# Patient Record
Sex: Female | Born: 1965 | Race: Black or African American | Hispanic: No | Smoking: Never smoker
Health system: Southern US, Community
[De-identification: ages and names within clinical notes are randomized; demographics above are authoritative.]

---

## 2005-02-14 ENCOUNTER — Other Ambulatory Visit: Payer: Self-pay

## 2005-02-14 ENCOUNTER — Emergency Department: Payer: Self-pay | Admitting: Emergency Medicine

## 2005-04-11 ENCOUNTER — Emergency Department: Payer: Self-pay | Admitting: Emergency Medicine

## 2005-05-03 ENCOUNTER — Ambulatory Visit: Payer: Self-pay | Admitting: Gastroenterology

## 2006-07-18 ENCOUNTER — Emergency Department: Payer: Self-pay | Admitting: Emergency Medicine

## 2006-07-18 ENCOUNTER — Other Ambulatory Visit: Payer: Self-pay

## 2007-01-27 ENCOUNTER — Ambulatory Visit: Payer: Self-pay | Admitting: Unknown Physician Specialty

## 2007-01-29 ENCOUNTER — Ambulatory Visit: Payer: Self-pay | Admitting: Unknown Physician Specialty

## 2007-02-15 ENCOUNTER — Emergency Department: Payer: Self-pay | Admitting: Emergency Medicine

## 2007-02-16 ENCOUNTER — Emergency Department: Payer: Self-pay | Admitting: Emergency Medicine

## 2007-02-16 ENCOUNTER — Inpatient Hospital Stay: Payer: Self-pay | Admitting: Surgery

## 2007-06-01 ENCOUNTER — Ambulatory Visit: Payer: Self-pay | Admitting: Internal Medicine

## 2007-06-10 ENCOUNTER — Ambulatory Visit: Payer: Self-pay | Admitting: Internal Medicine

## 2014-03-07 ENCOUNTER — Ambulatory Visit (INDEPENDENT_AMBULATORY_CARE_PROVIDER_SITE_OTHER): Payer: BC Managed Care – PPO

## 2014-03-07 ENCOUNTER — Encounter: Payer: Self-pay | Admitting: Podiatry

## 2014-03-07 ENCOUNTER — Ambulatory Visit (INDEPENDENT_AMBULATORY_CARE_PROVIDER_SITE_OTHER): Payer: BC Managed Care – PPO | Admitting: Podiatry

## 2014-03-07 VITALS — BP 125/82 | HR 65 | Resp 16 | Ht 64.0 in | Wt 196.0 lb

## 2014-03-07 DIAGNOSIS — M779 Enthesopathy, unspecified: Secondary | ICD-10-CM

## 2014-03-07 DIAGNOSIS — M722 Plantar fascial fibromatosis: Secondary | ICD-10-CM

## 2014-03-07 DIAGNOSIS — Q6650 Congenital pes planus, unspecified foot: Secondary | ICD-10-CM

## 2014-03-07 MED ORDER — MELOXICAM 15 MG PO TABS: 15.0000 mg | ORAL_TABLET | Freq: Every day | ORAL | Status: DC

## 2014-03-07 MED ORDER — METHYLPREDNISOLONE (PAK) 4 MG PO TABS: ORAL_TABLET | ORAL | Status: DC

## 2014-03-07 NOTE — Progress Notes (Signed)
She presents today complaining of bilateral heel pain. She states this been going on for quite some time now is starting to affect her ability to perform her daily activities. She denies any trauma to the feet and currently is not taking the medication by mouth.  Objective: Vital signs are stable she is alert and oriented x3. Pulses are strongly palpable bilateral. Neurologic sensorium is intact per Semmes-Weinstein monofilament. Deep tendon reflexes are intact bilateral. Muscle strength is 5 over 5 dorsiflexors plantar flexors inverters everters all intrinsic musculature is intact. Orthopedic evaluation demonstrates all joints distal to the ankle a full range of motion without crepitation. She has pain on palpation medial continued tubercles bilateral. Radiographic evaluation does demonstrate a soft tissue increase in density at the plantar fascial calcaneal insertion sites bilateral plantar distally oriented calcaneal heel spur with mild to moderate pes planus bilateral. Mild hallux valgus deformity is also noted bilateral.  Assessment: Pain in limb secondary to pes planus associated with plantar fasciitis bilateral.  Plan: Discussed etiology pathology conservative versus surgical therapies. Discussed appropriate shoe gear stretching exercises ice therapy and shoe gear modifications. Injected the bilateral heels today with Kenalog and local anesthetic. Started her on Medrol Dosepak to be followed by meloxicam. Put her in a plantar fascial brace bilaterally. She will start utilizing a night splint she has at home. I will followup with her in one month.

## 2014-03-09 ENCOUNTER — Inpatient Hospital Stay: Payer: Self-pay | Admitting: Surgery

## 2014-03-09 LAB — COMPREHENSIVE METABOLIC PANEL
ALK PHOS: 62 U/L
AST: 22 U/L (ref 15–37)
Albumin: 4.4 g/dL (ref 3.4–5.0)
Anion Gap: 11 (ref 7–16)
BUN: 22 mg/dL — ABNORMAL HIGH (ref 7–18)
Bilirubin,Total: 0.6 mg/dL (ref 0.2–1.0)
CALCIUM: 9.6 mg/dL (ref 8.5–10.1)
CO2: 27 mmol/L (ref 21–32)
CREATININE: 0.77 mg/dL (ref 0.60–1.30)
Chloride: 101 mmol/L (ref 98–107)
EGFR (African American): >60
GLUCOSE: 129 mg/dL — AB (ref 65–99)
OSMOLALITY: 283 (ref 275–301)
POTASSIUM: 3.8 mmol/L (ref 3.5–5.1)
SGPT (ALT): 28 U/L
Sodium: 139 mmol/L (ref 136–145)
TOTAL PROTEIN: 8.7 g/dL — AB (ref 6.4–8.2)

## 2014-03-09 LAB — URINALYSIS, COMPLETE
BLOOD: NEGATIVE
Bilirubin,UR: NEGATIVE
GLUCOSE, UR: NEGATIVE mg/dL (ref 0–75)
LEUKOCYTE ESTERASE: NEGATIVE
NITRITE: NEGATIVE
PH: 6 (ref 4.5–8.0)
Protein: 100
SPECIFIC GRAVITY: 1.027 (ref 1.003–1.030)
Squamous Epithelial: 5
WBC UR: 4 /HPF (ref 0–5)

## 2014-03-09 LAB — CBC
HCT: 43.1 % (ref 35.0–47.0)
HGB: 14.5 g/dL (ref 12.0–16.0)
MCH: 31.7 pg (ref 26.0–34.0)
MCHC: 33.6 g/dL (ref 32.0–36.0)
MCV: 94 fL (ref 80–100)
Platelet: 271 10*3/uL (ref 150–440)
RBC: 4.57 10*6/uL (ref 3.80–5.20)
RDW: 13.4 % (ref 11.5–14.5)
WBC: 10.8 10*3/uL (ref 3.6–11.0)

## 2014-03-09 LAB — LIPASE, BLOOD: Lipase: 115 U/L (ref 73–393)

## 2014-03-09 LAB — TROPONIN I

## 2014-03-10 LAB — PROTIME-INR
INR: 1.1
PROTHROMBIN TIME: 13.7 s (ref 11.5–14.7)

## 2014-03-10 LAB — COMPREHENSIVE METABOLIC PANEL
ALBUMIN: 3.3 g/dL — AB (ref 3.4–5.0)
ALK PHOS: 48 U/L
ALT: 23 U/L
Anion Gap: 7 (ref 7–16)
BILIRUBIN TOTAL: 0.4 mg/dL (ref 0.2–1.0)
BUN: 14 mg/dL (ref 7–18)
CALCIUM: 8.3 mg/dL — AB (ref 8.5–10.1)
Chloride: 108 mmol/L — ABNORMAL HIGH (ref 98–107)
Co2: 29 mmol/L (ref 21–32)
Creatinine: 0.66 mg/dL (ref 0.60–1.30)
EGFR (Non-African Amer.): >60
Glucose: 116 mg/dL — ABNORMAL HIGH (ref 65–99)
OSMOLALITY: 288 (ref 275–301)
POTASSIUM: 4.1 mmol/L (ref 3.5–5.1)
SGOT(AST): 22 U/L (ref 15–37)
Sodium: 144 mmol/L (ref 136–145)
TOTAL PROTEIN: 6.7 g/dL (ref 6.4–8.2)

## 2014-03-10 LAB — CBC WITH DIFFERENTIAL/PLATELET
Basophil #: 0 10*3/uL (ref 0.0–0.1)
Basophil %: 0.3 %
EOS ABS: 0.1 10*3/uL (ref 0.0–0.7)
Eosinophil %: 1.2 %
HCT: 38.4 % (ref 35.0–47.0)
HGB: 12.2 g/dL (ref 12.0–16.0)
LYMPHS ABS: 3.7 10*3/uL — AB (ref 1.0–3.6)
Lymphocyte %: 48.1 %
MCH: 30.8 pg (ref 26.0–34.0)
MCHC: 31.7 g/dL — ABNORMAL LOW (ref 32.0–36.0)
MCV: 97 fL (ref 80–100)
MONOS PCT: 10.3 %
Monocyte #: 0.8 x10 3/mm (ref 0.2–0.9)
Neutrophil #: 3.1 10*3/uL (ref 1.4–6.5)
Neutrophil %: 40.1 %
Platelet: 214 10*3/uL (ref 150–440)
RBC: 3.95 10*6/uL (ref 3.80–5.20)
RDW: 13.7 % (ref 11.5–14.5)
WBC: 7.7 10*3/uL (ref 3.6–11.0)

## 2014-03-10 LAB — AMYLASE: Amylase: 74 U/L (ref 25–115)

## 2014-03-10 LAB — APTT: ACTIVATED PTT: 29.4 s (ref 23.6–35.9)

## 2014-09-10 NOTE — Discharge Summary (Signed)
PATIENT NAME:  Danielle MedicusJOHNSON, Niyla A MR#:  [id number] DATE OF BIRTH:  1965-08-03  DATE OF ADMISSION:  03/09/2014 DATE OF DISCHARGE:  03/12/2014  PRINCIPAL DIAGNOSIS: Partial small-bowel obstruction, resolved.   OTHER DIAGNOSES:  1.  Status post exploratory laparotomy and lysis of adhesions, 2008.  2.  Appendectomy, 2008.  3.  Right oophorectomy, 2008.  4.  Partial hysterectomy.  5.  Cesarean section.   HOSPITAL COURSE: The patient was admitted to the hospital under nasogastric suction for approximately 48 hours and she began passing flatus and had contrast from her admission CT scan in her colon and had her nasogastric tube removed, and her diet was advanced over the ensuing 24 hours. She was tolerating a diet, having bowel movements, and passing flatus and was discharged home. She was given an appointment to see me or Dr. Juliann PulseLundquist in 1 week and to call the office in the interim for any problems.   ____________________________ Claude MangesWilliam F. Milany Geck, MD wfm:ST D: 03/18/2014 20:36:45 ET T: 03/19/2014 02:38:00 ET JOB#: [id number]  cc: Claude MangesWilliam F. Tonika Eden, MD, <Dictator> Claude MangesWILLIAM F Massey Ruhland MD ELECTRONICALLY SIGNED 03/20/2014 3:07

## 2014-09-10 NOTE — H&P (Signed)
History of Present Illness 65 yof who developed severe, crampy, mid abdominal pain, associated with nausea and vomiting about 15 hours ago. No fever. Normal BM yesterday, but says she tends toward constipation. Doesn't recall last flatus.  S/P Ex lap and LOA in 2008, and says these Sx are very reminiscent of that time.   Past History S/P Ex lap and LOA in 2008   Past Med/Surgical Hx:  Appendectomy  01/29/07:   right ovary removed 01/29/07:   Hysterectomy - Partial:   C-Section:   ALLERGIES:  No Known Allergies:   Family and Social History:  Family History Non-Contributory   Social History negative tobacco, negative ETOH, negative Illicit drugs, single, lives alone, here with daughter, works in [employer]   Review of Systems:  Fever/Chills No   Cough No   Sputum No   Abdominal Pain Yes   Diarrhea No   Constipation Yes   Nausea/Vomiting Yes   SOB/DOE No   Chest Pain No   Dysuria No   Tolerating PT Yes   Tolerating Diet No  Nauseated  Vomiting   Medications/Allergies Reviewed Medications/Allergies reviewed   Physical Exam:  GEN well developed, well nourished, no acute distress, obese   HEENT pink conjunctivae, PERRL, hearing intact to voice, moist oral mucosa, Oropharynx clear   NECK supple  trachea midline   RESP normal resp effort  clear BS  no use of accessory muscles   CARD regular rate  no murmur  no JVD  no Rub   ABD positive tenderness  nondistended, mild tenderness without peritoneal signs   EXTR negative cyanosis/clubbing   SKIN normal to palpation, skin turgor good   NEURO cranial nerves intact, follows commands, motor/sensory function intact   PSYCH alert, A+O to time, place, person, good insight   Lab Results: Hepatic:  21-Oct-15 03:53   Bilirubin, Total 0.6  Alkaline Phosphatase 62 (46-116 NOTE: New Reference Range 12/07/13)  SGPT (ALT) 28 (14-63 NOTE: New Reference Range 12/07/13)  SGOT  (AST) 22  Total Protein, Serum  8.7  Albumin, Serum 4.4  Routine Chem:  21-Oct-15 03:53   Lipase 115 (Result(s) reported on 09 Mar 2014 at 04:21AM.)  Glucose, Serum  129  BUN  22  Creatinine (comp) 0.77  Sodium, Serum 139  Potassium, Serum 3.8  Chloride, Serum 101  CO2, Serum 27  Calcium (Total), Serum 9.6  Osmolality (calc) 283  eGFR (African American) >60  eGFR (Non-African American) >60 (eGFR values <74m/min/1.73 m2 may be an indication of chronic kidney disease (CKD). Calculated eGFR, using the MRDR Study equation, is useful in  patients with stable renal function. The eGFR calculation will not be reliable in acutely ill patients when serum creatinine is changing rapidly. It is not useful in patients on dialysis. The eGFR calculation may not be applicable to patients at the low and high extremes of body sizes, pregnant women, and vetetarians.)  Anion Gap 11  Cardiac:  21-Oct-15 03:53   Troponin I < 0.02 (0.00-0.05 0.05 ng/mL or less: NEGATIVE  Repeat testing in 3-6 hrs  if clinically indicated. >0.05 ng/mL: POTENTIAL  MYOCARDIAL INJURY. Repeat  testing in 3-6 hrs if  clinically indicated. NOTE: An increase or decrease  of 30% or more on serial  testing suggests a  clinically important change)  Routine UA:  21-Oct-15 03:33   Color (UA) Yellow  Clarity (UA) Hazy  Glucose (UA) Negative  Bilirubin (UA) Negative  Ketones (UA) 2+  Specific Gravity (  UA) 1.027  Blood (UA) Negative  pH (UA) 6.0  Protein (UA) 100 mg/dL  Nitrite (UA) Negative  Leukocyte Esterase (UA) Negative (Result(s) reported on 09 Mar 2014 at 04:22AM.)  RBC (UA) 46 /HPF  WBC (UA) 4 /HPF  Bacteria (UA) TRACE  Epithelial Cells (UA) 5 /HPF  Mucous (UA) PRESENT (Result(s) reported on 09 Mar 2014 at 04:22AM.)  Routine Hem:  21-Oct-15 03:53   WBC (CBC) 10.8  RBC (CBC) 4.57  Hemoglobin (CBC) 14.5  Hematocrit (CBC) 43.1  Platelet Count (CBC) 271 (Result(s) reported on 09 Mar 2014 at 04:15AM.)   MCV 94  MCH 31.7  MCHC 33.6  RDW 13.4   Radiology Results: LabUnknown:    21-Oct-15 05:12, CT Abdomen and Pelvis With Contrast  PACS Image  CT:  CT Abdomen and Pelvis With Contrast  REASON FOR EXAM:    (1) abd pain and emesis eval obstruction; (2) abd   pain and emesis eval obstructi  COMMENTS:       PROCEDURE: CT  - CT ABDOMEN / PELVIS  W  - Mar 09 2014  5:12AM     CLINICAL DATA:  Abdominal pain since 3:30 p.m.. Nausea and vomiting.  Bloating. Epigastric and mid lower abdominal pain.    EXAM:  CT ABDOMEN AND PELVIS WITH CONTRAST    TECHNIQUE:  Multidetector CT imaging of the abdomen and pelvis was performed  using the standard protocol following bolus administration of  intravenous contrast.    CONTRAST:  100 mL Isovue-300    COMPARISON:  02/19/2007    FINDINGS:  Mild dependent changes in the lung bases.    Sub cm scattered low-attenuation lesions in the liver consistent  with small cysts. No change since prior study. The gallbladder,  spleen, pancreas, adrenal glands, abdominal aorta, inferior vena  cava, and retroperitoneal lymph nodes are unremarkable. Small stone  in the upper pole right kidney. No evidence of obstruction of either  kidney. Residual contrast material is present in the lower esophagus  which may indicate reflux or dysmotility. Stomach is unremarkable.  Mid abdominal and pelvic small bowel loops are distended with mild  wall thickening and fluid fluid levels. Distal ileum is  decompressed. Assistant with small bowel obstruction. Transition  zone appears to be in the central pelvis. There is a small  paraumbilical hernia containing fat. No bowel herniation is  demonstrated.    Pelvis: Small amount of free fluid in the pelvis is likely reactive.  Uterus appears surgically absent. No abnormal pelvic mass or  lymphadenopathy. Surgical absence of the gallbladder. No evidence of  diverticulitis. Normal alignment of the lumbar spine.      IMPRESSION:  Dilated mid abdominal and pelvic small bowel with transition zone in  the mid pelvis suggesting small bowel obstruction.      Electronically Signed    By: Lucienne Capers M.D.    On: 03/09/2014 05:20         Verified By: Neale Burly, M.D.,    Assessment/Admission Diagnosis Possible recurrent SBO.   Plan Admit, NG suction, IVF, observe   Electronic Signatures: Consuela Mimes (MD)  (Signed 21-Oct-15 06:33)  Authored: CHIEF COMPLAINT and HISTORY, PAST MEDICAL/SURGIAL HISTORY, ALLERGIES, FAMILY AND SOCIAL HISTORY, REVIEW OF SYSTEMS, PHYSICAL EXAM, LABS, Radiology, ASSESSMENT AND PLAN   Last Updated: 21-Oct-15 06:33 by Consuela Mimes (MD)

## 2014-12-14 ENCOUNTER — Ambulatory Visit: Payer: Self-pay | Admitting: Podiatry

## 2014-12-26 ENCOUNTER — Ambulatory Visit (INDEPENDENT_AMBULATORY_CARE_PROVIDER_SITE_OTHER): Payer: BLUE CROSS/BLUE SHIELD

## 2014-12-26 ENCOUNTER — Ambulatory Visit (INDEPENDENT_AMBULATORY_CARE_PROVIDER_SITE_OTHER): Payer: BLUE CROSS/BLUE SHIELD | Admitting: Podiatry

## 2014-12-26 VITALS — BP 118/78 | HR 87 | Resp 16

## 2014-12-26 DIAGNOSIS — M1990 Unspecified osteoarthritis, unspecified site: Secondary | ICD-10-CM

## 2014-12-26 MED ORDER — METHYLPREDNISOLONE 4 MG PO TBPK: ORAL_TABLET | ORAL | Status: DC

## 2014-12-26 NOTE — Progress Notes (Signed)
She presents today complaining of painful toes bilateral. States that she is recently started wearing stilted boots at work but her toes are starting to hurt on range of motion shortly before that. She denies changes in her past medical history medications allergies surgeries and social history.   Objective: Pulses are palpable bilateral. Neurologic sensorium is intact. She is good range of motion of the toes which are mildly tender on palpation dorsiflexion and extension. Pes planus flexible in nature bilateral. Pain on palpation medial calcaneal joint was bilateral.  Assessment: Plantar fasciitis with pes planus mild hammertoe deformities.  Plan: She was scanned for set of orthotics today and I wrote a prescription for Medrol.

## 2015-02-27 ENCOUNTER — Ambulatory Visit (INDEPENDENT_AMBULATORY_CARE_PROVIDER_SITE_OTHER): Payer: BLUE CROSS/BLUE SHIELD | Admitting: Podiatry

## 2015-02-27 DIAGNOSIS — M722 Plantar fascial fibromatosis: Secondary | ICD-10-CM

## 2015-02-27 NOTE — Patient Instructions (Signed)

## 2015-02-27 NOTE — Progress Notes (Signed)
Orthotics dispensed with oral and written directions for breakin. Recheck in 3-4 weeks for followup, if needed.

## 2015-04-03 ENCOUNTER — Ambulatory Visit: Payer: BLUE CROSS/BLUE SHIELD | Admitting: Podiatry

## 2016-05-31 ENCOUNTER — Encounter: Payer: Self-pay | Admitting: Emergency Medicine

## 2016-05-31 ENCOUNTER — Ambulatory Visit
Admission: EM | Admit: 2016-05-31 | Discharge: 2016-05-31 | Disposition: A | Discharge status: Home / Self Care | Payer: BLUE CROSS/BLUE SHIELD | Attending: Family Medicine | Admitting: Family Medicine

## 2016-05-31 DIAGNOSIS — J0100 Acute maxillary sinusitis, unspecified: Secondary | ICD-10-CM

## 2016-05-31 MED ORDER — HYDROCOD POLST-CPM POLST ER 10-8 MG/5ML PO SUER: 5.0000 mL | Freq: Two times a day (BID) | ORAL | 0 refills | Status: DC | PRN

## 2016-05-31 MED ORDER — AMOXICILLIN 875 MG PO TABS: 875.0000 mg | ORAL_TABLET | Freq: Two times a day (BID) | ORAL | 0 refills | Status: DC

## 2016-05-31 NOTE — ED Provider Notes (Signed)
[facility]    CSN: [id number] Arrival date & time: 05/31/16  1514     History   Chief Complaint Chief Complaint  Patient presents with  . Facial Pain    HPI Danielle Juarez is a 51 y.o. female.   The history is provided by the patient.  URI  Presenting symptoms: congestion, cough, facial pain and fatigue   Severity:  Moderate Onset quality:  Sudden Duration:  4 weeks Timing:  Constant Progression:  Worsening Chronicity:  New Relieved by:  Nothing Ineffective treatments:  OTC medications Associated symptoms: headaches and sinus pain   Risk factors: sick contacts   Risk factors: not elderly, no chronic cardiac disease, no chronic kidney disease, no chronic respiratory disease, no diabetes mellitus, no immunosuppression, no recent illness and no recent travel     History reviewed. No pertinent past medical history.  There are no active problems to display for this patient.   History reviewed. No pertinent surgical history.  OB History    No data available       Home Medications    Prior to Admission medications   Medication Sig Start Date End Date Taking? Authorizing Provider  amoxicillin (AMOXIL) 875 MG tablet Take 1 tablet (875 mg total) by mouth 2 (two) times daily. 05/31/16   Payton Mccallumrlando Mckenize Mezera, MD  Ascorbic Acid (VITAMIN C PO) Take by mouth daily.    Historical Provider, MD  CALCIUM PO Take by mouth daily.    Historical Provider, MD  chlorpheniramine-HYDROcodone (TUSSIONEX PENNKINETIC ER) 10-8 MG/5ML SUER Take 5 mLs by mouth every 12 (twelve) hours as needed. 05/31/16   Payton Mccallumrlando Wenzel Backlund, MD  Cholecalciferol (VITAMIN D PO) Take by mouth daily.    Historical Provider, MD  meloxicam (MOBIC) 15 MG tablet Take 1 tablet (15 mg total) by mouth daily. 03/07/14   Max T Hyatt, DPM  methylPREDNISolone (MEDROL) 4 MG TBPK tablet Tapering 6 day dose pack 12/26/14   Max T Hyatt, DPM  Naproxen Sodium (ALEVE PO) Take by mouth as needed.    Historical Provider, MD    Omega-3 Fatty Acids (FISH OIL PO) Take by mouth daily.    Historical Provider, MD  Probiotic Product (PROBIOTIC DAILY PO) Take by mouth daily.    Historical Provider, MD  Sodium Hyaluronate, Viscosup, (SUPARTZ IX) Inject into the articular space. Injection in knee once weekly and injection twice weekly    Historical Provider, MD    Family History No family history on file.  Social History Social History  Substance Use Topics  . Smoking status: Never Smoker  . Smokeless tobacco: Never Used  . Alcohol use Yes     Allergies   Patient has no known allergies.   Review of Systems Review of Systems  Constitutional: Positive for fatigue.  HENT: Positive for congestion and sinus pain.   Respiratory: Positive for cough.   Neurological: Positive for headaches.     Physical Exam Triage Vital Signs ED Triage Vitals  Enc Vitals Group     BP 05/31/16 1613 134/82     Pulse Rate 05/31/16 1613 75     Resp 05/31/16 1613 16     Temp 05/31/16 1613 97.8 F (36.6 C)     Temp Source 05/31/16 1613 Tympanic     SpO2 05/31/16 1613 100 %     Weight 05/31/16 1614 213 lb (96.6 kg)     Height 05/31/16 1614 5\' 4"  (1.626 m)     Head Circumference --  Peak Flow --      Pain Score 05/31/16 1616 10     Pain Loc --      Pain Edu? --      Excl. in GC? --    No data found.   Updated Vital Signs BP 134/82 (BP Location: Left Arm)   Pulse 75   Temp 97.8 F (36.6 C) (Tympanic)   Resp 16   Ht 5\' 4"  (1.626 m)   Wt 213 lb (96.6 kg)   SpO2 100%   BMI 36.56 kg/m   Visual Acuity Right Eye Distance:   Left Eye Distance:   Bilateral Distance:    Right Eye Near:   Left Eye Near:    Bilateral Near:     Physical Exam  Constitutional: She appears well-developed and well-nourished. No distress.  HENT:  Head: Normocephalic and atraumatic.  Right Ear: Tympanic membrane, external ear and ear canal normal.  Left Ear: Tympanic membrane, external ear and ear canal normal.  Nose: Mucosal edema  and rhinorrhea present. No nose lacerations, sinus tenderness, nasal deformity, septal deviation or nasal septal hematoma. No epistaxis.  No foreign bodies. Right sinus exhibits maxillary sinus tenderness and frontal sinus tenderness. Left sinus exhibits maxillary sinus tenderness and frontal sinus tenderness.  Mouth/Throat: Uvula is midline, oropharynx is clear and moist and mucous membranes are normal. No oropharyngeal exudate.  Eyes: Conjunctivae and EOM are normal. Pupils are equal, round, and reactive to light. Right eye exhibits no discharge. Left eye exhibits no discharge. No scleral icterus.  Neck: Normal range of motion. Neck supple. No thyromegaly present.  Cardiovascular: Normal rate, regular rhythm and normal heart sounds.   Pulmonary/Chest: Effort normal and breath sounds normal. No respiratory distress. She has no wheezes. She has no rales.  Lymphadenopathy:    She has no cervical adenopathy.  Skin: She is not diaphoretic.  Nursing note and vitals reviewed.    UC Treatments / Results  Labs (all labs ordered are listed, but only abnormal results are displayed) Labs Reviewed - No data to display  EKG  EKG Interpretation None       Radiology No results found.  Procedures Procedures (including critical care time)  Medications Ordered in UC Medications - No data to display   Initial Impression / Assessment and Plan / UC Course  I have reviewed the triage vital signs and the nursing notes.  Pertinent labs & imaging results that were available during my care of the patient were reviewed by me and considered in my medical decision making (see chart for details).  Clinical Course       Final Clinical Impressions(s) / UC Diagnoses   Final diagnoses:  Acute maxillary sinusitis, recurrence not specified    New Prescriptions Discharge Medication List as of 05/31/2016  5:55 PM    START taking these medications   Details  amoxicillin (AMOXIL) 875 MG tablet Take 1  tablet (875 mg total) by mouth 2 (two) times daily., Starting Fri 05/31/2016, Normal    chlorpheniramine-HYDROcodone (TUSSIONEX PENNKINETIC ER) 10-8 MG/5ML SUER Take 5 mLs by mouth every 12 (twelve) hours as needed., Starting Fri 05/31/2016, Normal       1. diagnosis reviewed with patient 2. rx as per orders above; reviewed possible side effects, interactions, risks and benefits  3. Follow-up prn if symptoms worsen or don't improve   Payton Mccallum, MD 05/31/16 1806

## 2016-05-31 NOTE — ED Triage Notes (Signed)
Sinus pressure, headache, cough, nasal congestion and dtrainage

## 2016-08-12 IMAGING — CR DG ABDOMEN 2V
1 series · 2 of 2 positions shown · non-contrast
Comparison: CT scan of the abdomen and pelvis of [DATE]st 9319

CLINICAL DATA: Clinical symptoms of small-bowel obstruction;
history of previous episode of small-bowel obstruction and surgery
for same

EXAM:
ABDOMEN - 2 VIEW

[Series 1: dxr abdomen 2 v flat and erect · 0.14mm/px · 2 of 2 slices shown]
[im 1/2]
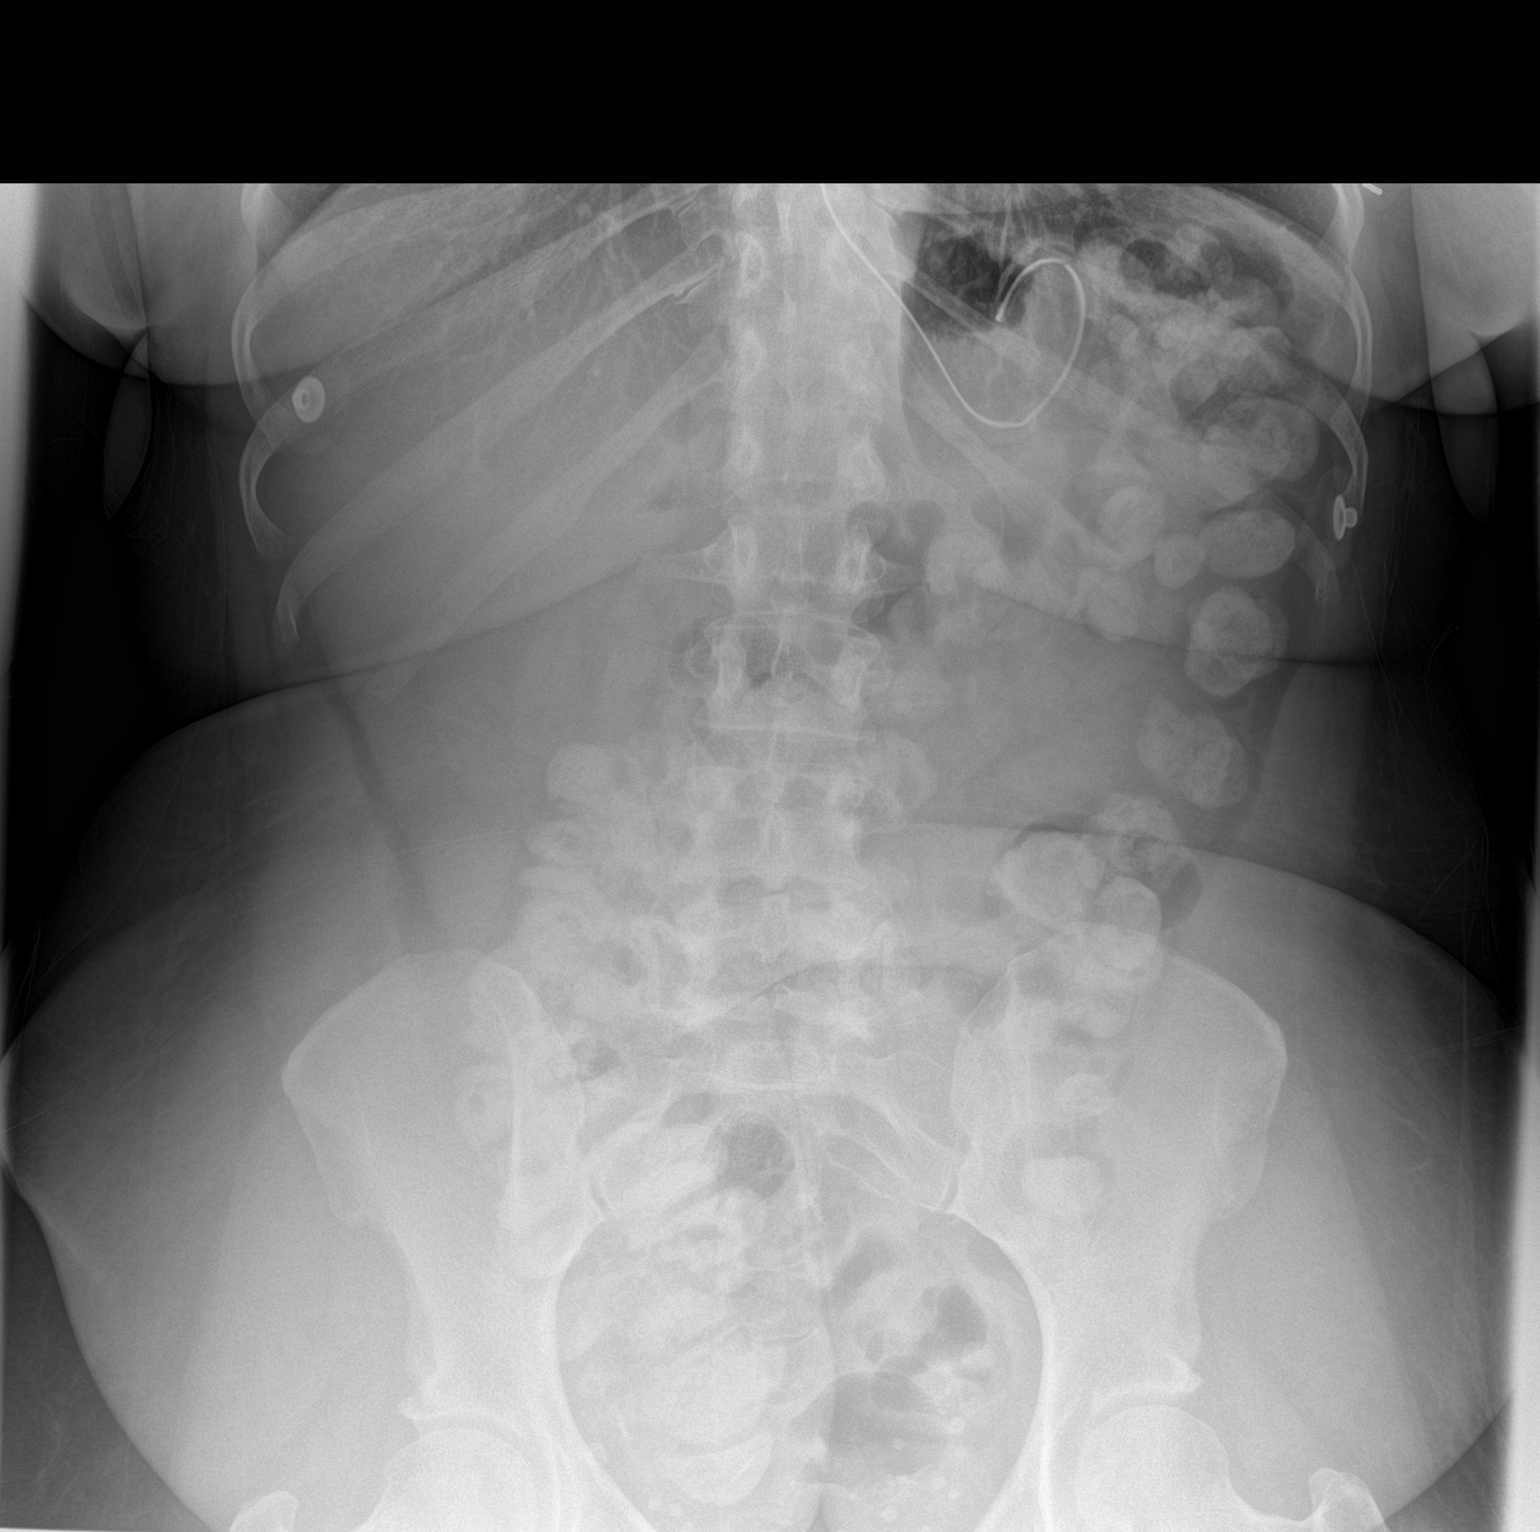
[im 2/2]
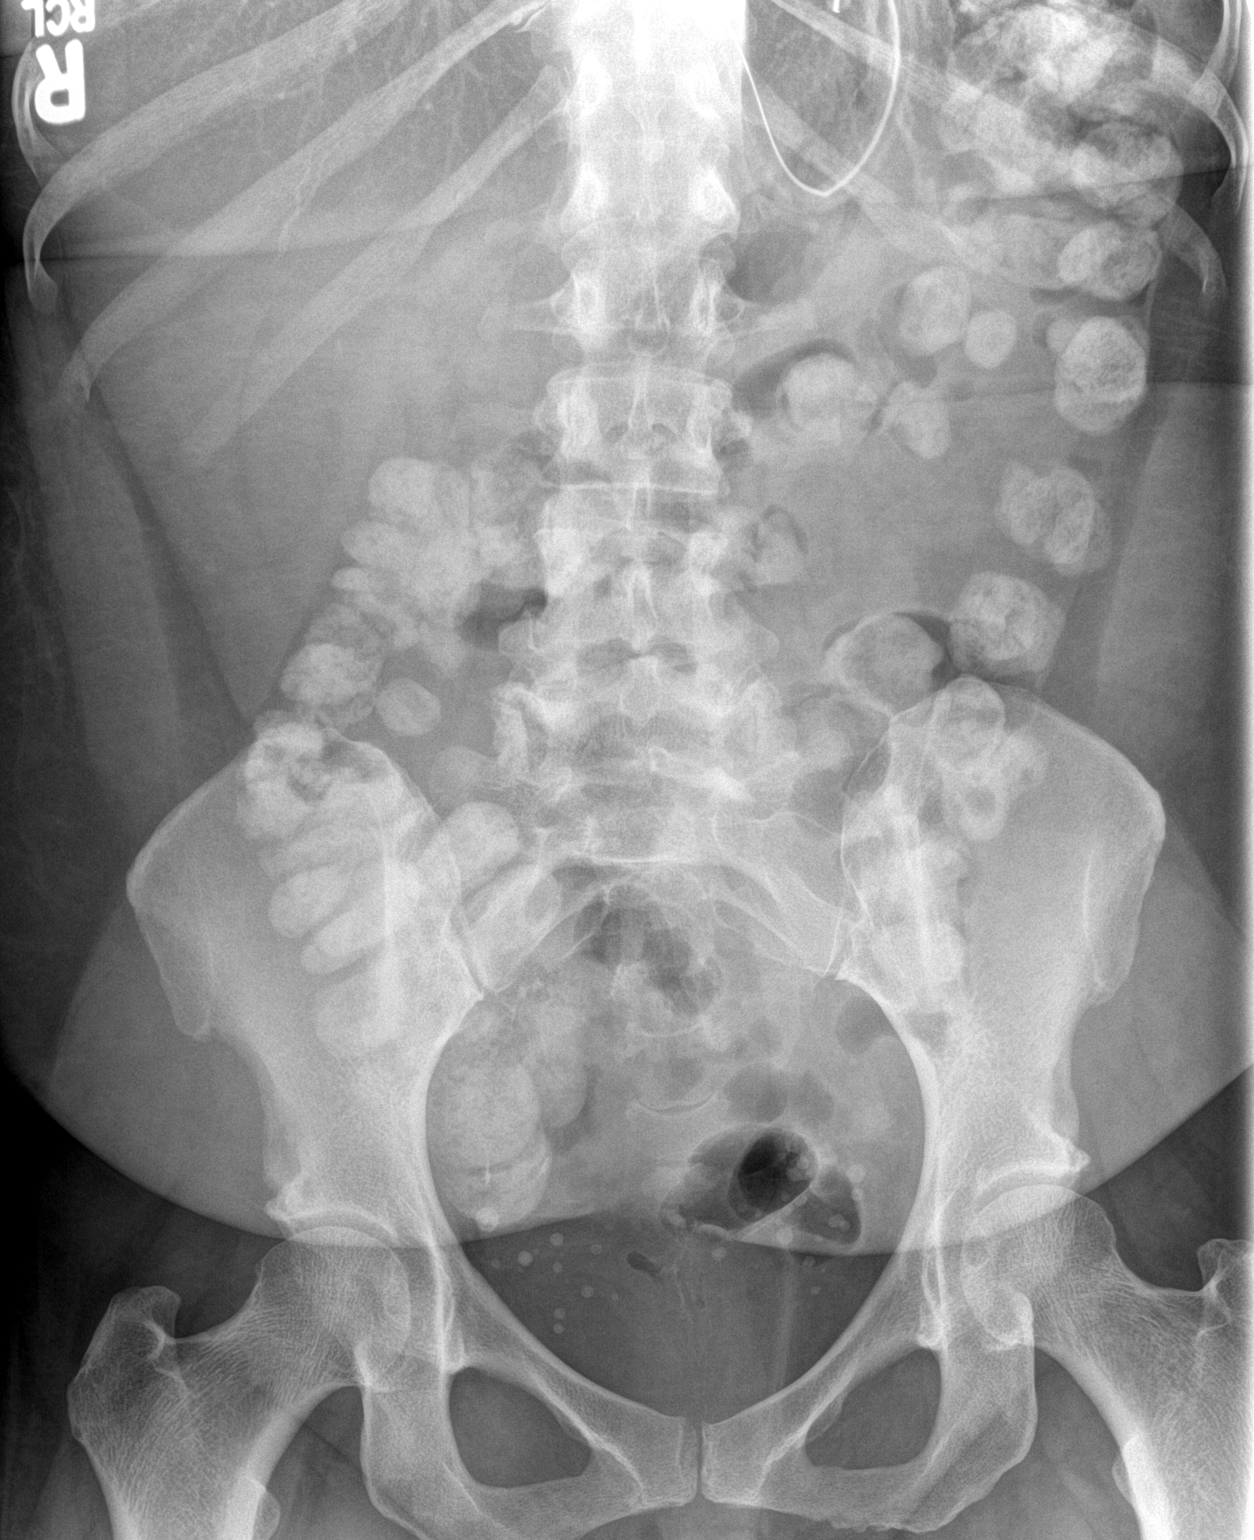

[2 of 2 positions shown; findings below may reference images not displayed]

FINDINGS: No abnormally dilated small bowel loops are demonstrated today. The
orally administered contrast from yesterday's CT scan has migrated
to the proximal sigmoid colon. There is no free extraluminal gas.
There are numerous pelvic phleboliths. The nasogastric tube tip
projects over the gastric cardia.
IMPRESSION: There is no evidence of persistent small bowel obstruction. No free
extraluminal gas collections are demonstrated.

## 2017-06-09 ENCOUNTER — Ambulatory Visit (INDEPENDENT_AMBULATORY_CARE_PROVIDER_SITE_OTHER): Payer: BLUE CROSS/BLUE SHIELD

## 2017-06-09 ENCOUNTER — Encounter: Payer: Self-pay | Admitting: Podiatry

## 2017-06-09 ENCOUNTER — Ambulatory Visit (INDEPENDENT_AMBULATORY_CARE_PROVIDER_SITE_OTHER): Payer: BLUE CROSS/BLUE SHIELD | Admitting: Podiatry

## 2017-06-09 DIAGNOSIS — M722 Plantar fascial fibromatosis: Secondary | ICD-10-CM | POA: Diagnosis not present

## 2017-06-09 MED ORDER — METHYLPREDNISOLONE 4 MG PO TBPK: ORAL_TABLET | ORAL | 0 refills | Status: DC

## 2017-06-09 MED ORDER — CELECOXIB 100 MG PO CAPS: 100.0000 mg | ORAL_CAPSULE | Freq: Two times a day (BID) | ORAL | 2 refills | Status: DC

## 2017-06-09 NOTE — Patient Instructions (Signed)

## 2017-06-09 NOTE — Progress Notes (Signed)
She presents today with a flareup of her bilateral plantar fasciitis times 2 months.  She states that the pain is sharp and shooting.  They throb and it is worse on the right than on the left.  She states is very painful to walk especially on the right foot she goes on to say she is been taking BC arthritis medicine and ibuprofen with no help.  She is also tried heating pads to no avail.  Objective: Signs are stable she is alert and oriented x3.  Pulses are palpable.  Neurologic sensorium is intact.  Deep tendon reflexes are intact.  Muscle strength was 5/5 dorsiflexors plantar flexors inverters everters onto the musculature is intact.  Orthopedic evaluation demonstrates all joints distal to the ankle full range of motion without crepitation.  Cutaneous evaluation demonstrates supple well-hydrated cutis there is no erythema edema cellulitis drainage or odor.  She has pain on palpation medial calcaneal tubercles bilateral extreme pain on palpation of the right heel.  No pain on medial and lateral compression of the calcaneus however.  Assessment: Pain in limb secondary to plantar fasciitis right greater than left.  Plan: Discussed etiology pathology conservative versus surgical therapies.  At this point after sterile Betadine skin prep and verbal consent I injected her bilateral heels today with Kenalog and local anesthetic.  Kenalog 20 mg local anesthetic 5 mg to each heel.  She tolerated procedure well without complication.  Placed her in bilateral plantar fascial braces and night splints.  Start her on a Medrol Dosepak to be followed by Celebrex.  I will follow-up with her in 1 month.  Once again discussed etiology pathology conservative versus surgical therapy discussed appropriate shoe gear stretching exercises ice therapy and shoe gear modifications.

## 2020-09-01 ENCOUNTER — Encounter: Payer: Self-pay | Admitting: Emergency Medicine

## 2020-09-01 ENCOUNTER — Other Ambulatory Visit: Payer: Self-pay

## 2020-09-01 ENCOUNTER — Ambulatory Visit
Admission: EM | Admit: 2020-09-01 | Discharge: 2020-09-01 | Disposition: A | Discharge status: Home / Self Care | Payer: BC Managed Care – PPO | Attending: Sports Medicine | Admitting: Sports Medicine

## 2020-09-01 DIAGNOSIS — R519 Headache, unspecified: Secondary | ICD-10-CM | POA: Diagnosis not present

## 2020-09-01 DIAGNOSIS — R0981 Nasal congestion: Secondary | ICD-10-CM | POA: Diagnosis not present

## 2020-09-01 DIAGNOSIS — Z79899 Other long term (current) drug therapy: Secondary | ICD-10-CM | POA: Insufficient documentation

## 2020-09-01 DIAGNOSIS — J069 Acute upper respiratory infection, unspecified: Secondary | ICD-10-CM | POA: Insufficient documentation

## 2020-09-01 DIAGNOSIS — R059 Cough, unspecified: Secondary | ICD-10-CM

## 2020-09-01 DIAGNOSIS — Z20822 Contact with and (suspected) exposure to covid-19: Secondary | ICD-10-CM | POA: Diagnosis not present

## 2020-09-01 LAB — GROUP A STREP BY PCR: Group A Strep by PCR: NOT DETECTED

## 2020-09-01 MED ORDER — PROMETHAZINE-DM 6.25-15 MG/5ML PO SYRP: 5.0000 mL | ORAL_SOLUTION | Freq: Four times a day (QID) | ORAL | 0 refills | Status: DC | PRN

## 2020-09-01 MED ORDER — IBUPROFEN 800 MG PO TABS
800.0000 mg | ORAL_TABLET | Freq: Three times a day (TID) | ORAL | 0 refills | Status: AC | PRN
Start: 2020-09-01 — End: 2020-09-08

## 2020-09-01 NOTE — Discharge Instructions (Addendum)
URI/COLD SYMPTOMS: Negative strep. Your exam today is consistent with a viral illness. Antibiotics are not indicated at this time. Use medications as directed, including cough syrup, nasal saline, and decongestants. Your symptoms should improve over the next few days and resolve within 7-10 days. Increase rest and fluids. F/u if symptoms worsen or predominate such as sore throat, ear pain, productive cough, shortness of breath, or if you develop high fevers or worsening fatigue over the next several days.    You have received COVID testing today either for positive exposure, concerning symptoms that could be related to COVID infection, screening purposes, or re-testing after confirmed positive.  Your test obtained today checks for active viral infection in the last 1-2 weeks. If your test is negative now, you can still test positive later. So, if you do develop symptoms you should either get re-tested and/or isolate x 5 days and then strict mask use x 5 days (unvaccinated) or mask use x 10 days (vaccinated). Please follow CDC guidelines.  While Rapid antigen tests come back in 15-20 minutes, send out PCR/molecular test results typically come back within 1-3 days. In the mean time, if you are symptomatic, assume this could be a positive test and treat/monitor yourself as if you do have COVID.   We will call with test results if positive. Please download the MyChart app and set up a profile to access test results.   If symptomatic, go home and rest. Push fluids. Take Tylenol as needed for discomfort. Gargle warm salt water. Throat lozenges. Take Mucinex DM or Robitussin for cough. Humidifier in bedroom to ease coughing. Warm showers. Also review the COVID handout for more information.  COVID-19 INFECTION: The incubation period of COVID-19 is approximately 14 days after exposure, with most symptoms developing in roughly 4-5 days. Symptoms may range in severity from mild to critically severe. Roughly 80% of  those infected will have mild symptoms. People of any age may become infected with COVID-19 and have the ability to transmit the virus. The most common symptoms include: fever, fatigue, cough, body aches, headaches, sore throat, nasal congestion, shortness of breath, nausea, vomiting, diarrhea, changes in smell and/or taste.    COURSE OF ILLNESS Some patients may begin with mild disease which can progress quickly into critical symptoms. If your symptoms are worsening please call ahead to the Emergency Department and proceed there for further treatment. Recovery time appears to be roughly 1-2 weeks for mild symptoms and 3-6 weeks for severe disease.   GO IMMEDIATELY TO ER FOR FEVER YOU ARE UNABLE TO GET DOWN WITH TYLENOL, BREATHING PROBLEMS, CHEST PAIN, FATIGUE, LETHARGY, INABILITY TO EAT OR DRINK, ETC  QUARANTINE AND ISOLATION: To help decrease the spread of COVID-19 please remain isolated if you have COVID infection or are highly suspected to have COVID infection. This means -stay home and isolate to one room in the home if you live with others. Do not share a bed or bathroom with others while ill, sanitize and wipe down all countertops and keep common areas clean and disinfected. Stay home for 5 days. If you have no symptoms or your symptoms are resolving after 5 days, you can leave your house. Continue to wear a mask around others for 5 additional days. If you have been in close contact (within 6 feet) of someone diagnosed with COVID 19, you are advised to quarantine in your home for 14 days as symptoms can develop anywhere from 2-14 days after exposure to the virus. If you develop symptoms,   you  must isolate.  Most current guidelines for COVID after exposure -unvaccinated: isolate 5 days and strict mask use x 5 days. Test on day 5 is possible -vaccinated: wear mask x 10 days if symptoms do not develop -You do not necessarily need to be tested for COVID if you have + exposure and  develop symptoms.  Just isolate at home x10 days from symptom onset During this global pandemic, CDC advises to practice social distancing, try to stay at least 6ft away from others at all times. Wear a face covering. Wash and sanitize your hands regularly and avoid going anywhere that is not necessary.  KEEP IN MIND THAT THE COVID TEST IS NOT 100% ACCURATE AND YOU SHOULD STILL DO EVERYTHING TO PREVENT POTENTIAL SPREAD OF VIRUS TO OTHERS (WEAR MASK, WEAR GLOVES, WASH HANDS AND SANITIZE REGULARLY). IF INITIAL TEST IS NEGATIVE, THIS MAY NOT MEAN YOU ARE DEFINITELY NEGATIVE. MOST ACCURATE TESTING IS DONE 5-7 DAYS AFTER EXPOSURE.   It is not advised by CDC to get re-tested after receiving a positive COVID test since you can still test positive for weeks to months after you have already cleared the virus.   *If you have not been vaccinated for COVID, I strongly suggest you consider getting vaccinated as long as there are no contraindications.   

## 2020-09-01 NOTE — ED Triage Notes (Signed)
Patient c/o cough, headache, sore throat, nasal congestion that started 2 days ago.

## 2020-09-01 NOTE — ED Provider Notes (Signed)
[facility]    CSN: [id number] Arrival date & time: 09/01/20  1544      History   Chief Complaint Chief Complaint  Patient presents with  . Cough  . Sore Throat  . Nasal Congestion  . Headache    HPI Danielle Juarez is a 55 y.o. female presenting for 2-day history of headache, fatigue, cough, nasal congestion, and sore throat.  Patient also admits a lot of postnasal drainage and feels like she has mucus stuck in her throat.  She has not had a fever.  She denies any body aches, chest pain, breathing ability, abdominal pain, nausea/vomiting or diarrhea.  No sick contacts and no known exposure to influenza or COVID-19.  Has been taking Mucinex without much relief in symptoms.  Patient states that she needs something different to help her symptoms.  She also would like work note.  She has no other complaints or concerns.  HPI  History reviewed. No pertinent past medical history.  There are no problems to display for this patient.   History reviewed. No pertinent surgical history.  OB History   No obstetric history on file.      Home Medications    Prior to Admission medications   Medication Sig Start Date End Date Taking? Authorizing Provider  ibuprofen (ADVIL) 800 MG tablet Take 1 tablet (800 mg total) by mouth every 8 (eight) hours as needed for up to 7 days. 09/01/20 09/08/20 Yes Shirlee Latch, PA-C  promethazine-dextromethorphan (PROMETHAZINE-DM) 6.25-15 MG/5ML syrup Take 5 mLs by mouth 4 (four) times daily as needed for cough. 09/01/20  Yes Shirlee Latch, PA-C  Ascorbic Acid (VITAMIN C PO) Take by mouth daily.    [provider]  CALCIUM PO Take by mouth daily.    [provider]  celecoxib (CELEBREX) 100 MG capsule Take 1 capsule (100 mg total) by mouth 2 (two) times daily. 06/09/17   Hyatt, Max T, DPM  Cholecalciferol (VITAMIN D PO) Take by mouth daily.    [provider]  meloxicam (MOBIC) 15 MG tablet Take 1 tablet (15 mg  total) by mouth daily. 03/07/14   Hyatt, Max T, DPM  methylPREDNISolone (MEDROL DOSEPAK) 4 MG TBPK tablet 6 day dose pack - take as directed 06/09/17   Hyatt, Max T, DPM  Naproxen Sodium (ALEVE PO) Take by mouth as needed.    [provider]  Omega-3 Fatty Acids (FISH OIL PO) Take by mouth daily.    [provider]  Probiotic Product (PROBIOTIC DAILY PO) Take by mouth daily.    [provider]  Sodium Hyaluronate, Viscosup, (SUPARTZ IX) Inject into the articular space. Injection in knee once weekly and injection twice weekly    [provider]    Family History No family history on file.  Social History Social History   Tobacco Use  . Smoking status: Never Smoker  . Smokeless tobacco: Never Used  Substance Use Topics  . Alcohol use: Yes  . Drug use: Never     Allergies   Patient has no known allergies.   Review of Systems Review of Systems  Constitutional: Positive for fatigue. Negative for chills, diaphoresis and fever.  HENT: Positive for congestion, rhinorrhea and sore throat. Negative for ear pain, sinus pressure and sinus pain.   Respiratory: Positive for cough. Negative for shortness of breath.   Gastrointestinal: Negative for abdominal pain, nausea and vomiting.  Musculoskeletal: Negative for arthralgias and myalgias.  Skin: Negative for rash.  Neurological:  Positive for headaches. Negative for weakness.  Hematological: Negative for adenopathy.     Physical Exam Triage Vital Signs ED Triage Vitals  Enc Vitals Group     BP 09/01/20 1555 (!) 141/88     Pulse Rate 09/01/20 1555 78     Resp 09/01/20 1555 18     Temp 09/01/20 1555 99.1 F (37.3 C)     Temp Source 09/01/20 1555 Oral     SpO2 09/01/20 1555 98 %     Weight 09/01/20 1553 212 lb 15.4 oz (96.6 kg)     Height 09/01/20 1553 5\' 4"  (1.626 m)     Head Circumference --      Peak Flow --      Pain Score 09/01/20 1553 10     Pain Loc --      Pain Edu? --      Excl. in  GC? --    No data found.  Updated Vital Signs BP (!) 141/88 (BP Location: Right Arm)   Pulse 78   Temp 99.1 F (37.3 C) (Oral)   Resp 18   Ht 5\' 4"  (1.626 m)   Wt 212 lb 15.4 oz (96.6 kg)   SpO2 98%   BMI 36.56 kg/m       Physical Exam Vitals and nursing note reviewed.  Constitutional:      General: She is not in acute distress.    Appearance: Normal appearance. She is well-developed. She is not ill-appearing or toxic-appearing.  HENT:     Head: Normocephalic and atraumatic.     Right Ear: Tympanic membrane, ear canal and external ear normal.     Left Ear: Tympanic membrane, ear canal and external ear normal.     Nose: Congestion and rhinorrhea present.     Mouth/Throat:     Mouth: Mucous membranes are moist.     Pharynx: Oropharynx is clear. Posterior oropharyngeal erythema (large amount of PND) present.  Eyes:     General: No scleral icterus.       Right eye: No discharge.        Left eye: No discharge.     Conjunctiva/sclera: Conjunctivae normal.  Cardiovascular:     Rate and Rhythm: Normal rate and regular rhythm.     Heart sounds: Normal heart sounds.  Pulmonary:     Effort: Pulmonary effort is normal. No respiratory distress.     Breath sounds: Normal breath sounds.  Musculoskeletal:     Cervical back: Neck supple.  Skin:    General: Skin is dry.  Neurological:     General: No focal deficit present.     Mental Status: She is alert. Mental status is at baseline.     Motor: No weakness.     Gait: Gait normal.  Psychiatric:        Mood and Affect: Mood normal.        Behavior: Behavior normal.        Thought Content: Thought content normal.      UC Treatments / Results  Labs (all labs ordered are listed, but only abnormal results are displayed) Labs Reviewed  GROUP A STREP BY PCR  SARS CORONAVIRUS 2 (TAT 6-24 HRS)    EKG   Radiology No results found.  Procedures Procedures (including critical care time)  Medications Ordered in  UC Medications - No data to display  Initial Impression / Assessment and Plan / UC Course  I have reviewed the triage vital signs and the nursing notes.  Pertinent labs & imaging results that were available during my care of the patient were reviewed by me and considered in my medical decision making (see chart for details).   55 year old female presenting for 2-day history of cough, congestion, sore throat, fatigue and headaches.  Molecular strep test obtained. Negative.   Covid is obtained.  Current CDC guidelines, isolation protocol and ED precautions reviewed patient.  Advised this is likely a viral illness and supportive care encouraged.  Advised increased rest and fluids.  I have sent Promethazine DM and ibuprofen at her milligrams tablets.  Advised for her to follow-up with our department for any worsening symptoms or if she is not better after 10 days.  ED precautions reviewed.  Work note given.   Final Clinical Impressions(s) / UC Diagnoses   Final diagnoses:  Viral upper respiratory tract infection  Cough  Nasal congestion  Acute nonintractable headache, unspecified headache type     Discharge Instructions     URI/COLD SYMPTOMS: Your exam today is consistent with a viral illness. Antibiotics are not indicated at this time. Use medications as directed, including cough syrup, nasal saline, and decongestants. Your symptoms should improve over the next few days and resolve within 7-10 days. Increase rest and fluids. F/u if symptoms worsen or predominate such as sore throat, ear pain, productive cough, shortness of breath, or if you develop high fevers or worsening fatigue over the next several days.      ED Prescriptions    Medication Sig Dispense Auth. Provider   ibuprofen (ADVIL) 800 MG tablet Take 1 tablet (800 mg total) by mouth every 8 (eight) hours as needed for up to 7 days. 21 tablet Shirlee Latch, PA-C   promethazine-dextromethorphan (PROMETHAZINE-DM) 6.25-15  MG/5ML syrup Take 5 mLs by mouth 4 (four) times daily as needed for cough. 150 mL Shirlee Latch, PA-C     PDMP not reviewed this encounter.   Shirlee Latch, PA-C 09/01/20 1705

## 2020-09-02 LAB — SARS CORONAVIRUS 2 (TAT 6-24 HRS): SARS Coronavirus 2: NEGATIVE

## 2020-12-20 ENCOUNTER — Other Ambulatory Visit: Payer: Self-pay

## 2020-12-20 ENCOUNTER — Encounter: Payer: Self-pay | Admitting: Podiatry

## 2020-12-20 ENCOUNTER — Ambulatory Visit (INDEPENDENT_AMBULATORY_CARE_PROVIDER_SITE_OTHER): Payer: BC Managed Care – PPO

## 2020-12-20 ENCOUNTER — Ambulatory Visit (INDEPENDENT_AMBULATORY_CARE_PROVIDER_SITE_OTHER): Payer: BC Managed Care – PPO | Admitting: Podiatry

## 2020-12-20 DIAGNOSIS — M179 Osteoarthritis of knee, unspecified: Secondary | ICD-10-CM | POA: Insufficient documentation

## 2020-12-20 DIAGNOSIS — M722 Plantar fascial fibromatosis: Secondary | ICD-10-CM | POA: Diagnosis not present

## 2020-12-20 DIAGNOSIS — M1710 Unilateral primary osteoarthritis, unspecified knee: Secondary | ICD-10-CM | POA: Insufficient documentation

## 2020-12-20 MED ORDER — METHYLPREDNISOLONE 4 MG PO TBPK: ORAL_TABLET | ORAL | 0 refills | Status: DC

## 2020-12-20 MED ORDER — CELECOXIB 200 MG PO CAPS: 200.0000 mg | ORAL_CAPSULE | Freq: Two times a day (BID) | ORAL | 3 refills | Status: DC

## 2020-12-20 MED ORDER — TRIAMCINOLONE ACETONIDE 40 MG/ML IJ SUSP
40.0000 mg | Freq: Once | INTRAMUSCULAR | Status: AC
  Administered 2020-12-20: 40 mg

## 2020-12-20 NOTE — Progress Notes (Signed)
She presents today after having not been seen since 2019 with a chief complaint of painful heels bilaterally.  States that she has bad knees and bad heels and that she needs her knees fixed and would like to consider orthotics for her heels.  She still has to wear steel toe shoes to work she is having to use ibuprofen and Biofreeze to maintain in general state of analgesia.  Objective: Vital signs are stable alert and oriented x3.  Pulses are palpable severe pes planus bilateral with some dorsal spurring on palpation as well as radiographs.  Pain on palpation medial calcaneal tubercles bilateral.  Assessment: Severe pes planus bilateral osteoarthritis midfoot bilateral Planter fasciitis bilateral right greater than left.  Plan: Injected the bilateral heels today placed her on methylprednisolone pack be followed by Celebrex.  She will follow-up with [facility] for new set of orthotics.

## 2021-01-17 ENCOUNTER — Other Ambulatory Visit: Payer: BC Managed Care – PPO

## 2021-01-31 ENCOUNTER — Ambulatory Visit (INDEPENDENT_AMBULATORY_CARE_PROVIDER_SITE_OTHER): Payer: BC Managed Care – PPO

## 2021-01-31 ENCOUNTER — Other Ambulatory Visit: Payer: Self-pay

## 2021-01-31 ENCOUNTER — Ambulatory Visit: Payer: BC Managed Care – PPO | Admitting: Podiatry

## 2021-01-31 DIAGNOSIS — M722 Plantar fascial fibromatosis: Secondary | ICD-10-CM

## 2021-01-31 MED ORDER — CELECOXIB 200 MG PO CAPS: 200.0000 mg | ORAL_CAPSULE | Freq: Two times a day (BID) | ORAL | 3 refills | Status: DC

## 2021-01-31 MED ORDER — METHYLPREDNISOLONE 4 MG PO TBPK: ORAL_TABLET | ORAL | 0 refills | Status: DC

## 2021-01-31 NOTE — Progress Notes (Signed)
Patient presents today to be casted for custom molded orthotics. Dr. Al Corpus has been treating patient for plantar fasciitis.   Patient info-  Shoe size: 9  Shoe style: sneaker  Height: 5'4"  Weight: 233   Patient will be notified once orthotics arrive in office and reappoint for fitting at that time.    Patient stated that she did not pick up Celebrex and Medrol dose pack at last visit because the injection helped her.  She stated that her feet are hurting again and requested the scripts be called back in.  Per Dr. Al Corpus, ok to send in.   Scripts have been sent to pharmacy

## 2021-03-12 ENCOUNTER — Other Ambulatory Visit: Payer: Self-pay

## 2021-03-12 ENCOUNTER — Ambulatory Visit (INDEPENDENT_AMBULATORY_CARE_PROVIDER_SITE_OTHER): Payer: BC Managed Care – PPO | Admitting: Podiatry

## 2021-03-12 ENCOUNTER — Encounter: Payer: Self-pay | Admitting: Podiatry

## 2021-03-12 DIAGNOSIS — M722 Plantar fascial fibromatosis: Secondary | ICD-10-CM

## 2021-03-12 MED ORDER — METHYLPREDNISOLONE 4 MG PO TBPK: ORAL_TABLET | ORAL | 0 refills | Status: DC

## 2021-03-12 MED ORDER — TRIAMCINOLONE ACETONIDE 40 MG/ML IJ SUSP
40.0000 mg | Freq: Once | INTRAMUSCULAR | Status: AC
  Administered 2021-03-12: 40 mg

## 2021-03-12 NOTE — Progress Notes (Signed)
She presents today stating that the dorsal aspect of her foot bilaterally is tender.  He states that the heels have been painful for quite some time.  Objective: Vital signs are stable she is alert and oriented x3.  Pulses are palpable.  She still has pain on palpation to the bilateral heels with tenderness at the TMT joints dorsally bilaterally.  No erythema cellulitis drainage or odor.  Assessment: Resolving Planter fasciitis dorsal pain.  Plan: Injected the bilateral heels once again today 10 mg Kenalog 5 mg Marcaine point of maximal tenderness.  Instructed her on the use of Voltaren gel and we will follow-up with her in a couple of weeks.  Also refilled her methylprednisolone

## 2021-03-14 ENCOUNTER — Ambulatory Visit: Payer: BC Managed Care – PPO | Admitting: Podiatry

## 2021-04-23 ENCOUNTER — Ambulatory Visit: Payer: BC Managed Care – PPO | Admitting: Podiatry

## 2021-05-15 ENCOUNTER — Telehealth: Payer: Self-pay | Admitting: Podiatry

## 2021-05-15 NOTE — Telephone Encounter (Signed)
Patient called rescheduled her appt with Dr Al Corpus. Patient wanted to see if she could get a refill on her arthritis medication. Patient is out. Patient uses [facility]. Patient made an appt in January.

## 2021-05-16 ENCOUNTER — Other Ambulatory Visit: Payer: Self-pay | Admitting: Podiatry

## 2021-05-16 MED ORDER — CELECOXIB 200 MG PO CAPS: 200.0000 mg | ORAL_CAPSULE | Freq: Two times a day (BID) | ORAL | 3 refills | Status: DC

## 2021-05-16 MED ORDER — CELECOXIB 200 MG PO CAPS: 200.0000 mg | ORAL_CAPSULE | Freq: Two times a day (BID) | ORAL | 0 refills | Status: AC

## 2021-05-16 NOTE — Telephone Encounter (Signed)
Celebrex refill #60, no refills, sent to pharmacy

## 2021-06-13 ENCOUNTER — Other Ambulatory Visit: Payer: Self-pay

## 2021-06-13 ENCOUNTER — Encounter: Payer: Self-pay | Admitting: Podiatry

## 2021-06-13 ENCOUNTER — Ambulatory Visit (INDEPENDENT_AMBULATORY_CARE_PROVIDER_SITE_OTHER): Payer: BC Managed Care – PPO | Admitting: Podiatry

## 2021-06-13 DIAGNOSIS — S93691A Other sprain of right foot, initial encounter: Secondary | ICD-10-CM | POA: Diagnosis not present

## 2021-06-13 DIAGNOSIS — M722 Plantar fascial fibromatosis: Secondary | ICD-10-CM | POA: Diagnosis not present

## 2021-06-14 NOTE — Progress Notes (Signed)
She presents today for follow-up of her bilateral heel pain states that is still really hurts she has been wearing her insoles doing everything we have asked her today and she states that every time it seems to get better and get her right back down to 0.  After the medications and feels great after the injections and feels great but then goes back to 0.  Objective: Vital signs are stable she is alert oriented x3 pulses are palpable.  Presents today ambulating in her tennis shoes with insoles however the backs are flattened down on the shoes.  Assessment chronic intractable Planter fasciitis probable tear of the plantar fascia on the right side.  Plan: Discussed etiology pathology and surgical therapies this point, requesting MRI of the right foot looking for a tear of the plantar fascial differential diagnosis and surgical planning.

## 2021-07-03 ENCOUNTER — Other Ambulatory Visit: Payer: BC Managed Care – PPO

## 2021-07-10 ENCOUNTER — Encounter: Payer: Self-pay | Admitting: Podiatry

## 2021-07-10 NOTE — Progress Notes (Unsigned)
MRI Authorization-  P2P done today by Dr. Milinda Pointer - authorization [id number] thru 07/31/21  Information sent to [facility] to [employer]

## 2021-07-25 ENCOUNTER — Encounter: Payer: BC Managed Care – PPO | Admitting: Podiatry

## 2022-06-10 ENCOUNTER — Other Ambulatory Visit: Payer: Self-pay | Admitting: Podiatry

## 2022-06-10 DIAGNOSIS — S93691A Other sprain of right foot, initial encounter: Secondary | ICD-10-CM
